# Patient Record
Sex: Female | Born: 1980 | Race: Asian | Hispanic: No | Marital: Married | Smoking: Never smoker
Health system: Southern US, Community
[De-identification: ages and names within clinical notes are randomized; demographics above are authoritative.]

## PROBLEM LIST (undated history)

## (undated) DIAGNOSIS — H5210 Myopia, unspecified eye: Secondary | ICD-10-CM

## (undated) DIAGNOSIS — H52209 Unspecified astigmatism, unspecified eye: Secondary | ICD-10-CM

## (undated) HISTORY — DX: Unspecified astigmatism, unspecified eye: H52.209

## (undated) HISTORY — DX: Myopia, unspecified eye: H52.10

---

## 2014-03-10 DIAGNOSIS — H04129 Dry eye syndrome of unspecified lacrimal gland: Secondary | ICD-10-CM | POA: Insufficient documentation

## 2014-03-10 DIAGNOSIS — H5210 Myopia, unspecified eye: Secondary | ICD-10-CM | POA: Insufficient documentation

## 2014-11-19 DIAGNOSIS — S52009A Unspecified fracture of upper end of unspecified ulna, initial encounter for closed fracture: Secondary | ICD-10-CM | POA: Insufficient documentation

## 2015-01-10 ENCOUNTER — Ambulatory Visit (INDEPENDENT_AMBULATORY_CARE_PROVIDER_SITE_OTHER): Payer: 59 | Admitting: Family Medicine

## 2015-01-10 ENCOUNTER — Encounter: Payer: Self-pay | Admitting: Family Medicine

## 2015-01-10 ENCOUNTER — Ambulatory Visit (HOSPITAL_BASED_OUTPATIENT_CLINIC_OR_DEPARTMENT_OTHER)
Admission: RE | Admit: 2015-01-10 | Discharge: 2015-01-10 | Disposition: A | Discharge status: Home / Self Care | Payer: 59 | Source: Ambulatory Visit | Attending: Family Medicine | Admitting: Family Medicine

## 2015-01-10 VITALS — BP 117/77 | HR 77 | Ht 65.0 in | Wt 120.0 lb

## 2015-01-10 DIAGNOSIS — S52002D Unspecified fracture of upper end of left ulna, subsequent encounter for closed fracture with routine healing: Secondary | ICD-10-CM | POA: Diagnosis present

## 2015-01-10 DIAGNOSIS — X58XXXD Exposure to other specified factors, subsequent encounter: Secondary | ICD-10-CM | POA: Diagnosis not present

## 2015-01-10 DIAGNOSIS — S52002A Unspecified fracture of upper end of left ulna, initial encounter for closed fracture: Secondary | ICD-10-CM | POA: Diagnosis not present

## 2015-01-10 DIAGNOSIS — M25732 Osteophyte, left wrist: Secondary | ICD-10-CM | POA: Diagnosis not present

## 2015-01-10 NOTE — Patient Instructions (Signed)
Your proximal ulna fracture is healing excellently with bony callus. At this point your pain and lack of motion are because of stiffness from the cast. [place] [facility] [place] [facility] - hopefully you can get a couple visits in before you leave next Thursday. Take the prescription [place] you as [place]. Take tylenol or motrin only if needed for pain. Call me if you have any questions. It will take you a few weeks more with therapy until you feel back to normal.

## 2015-01-12 DIAGNOSIS — R6889 Other general symptoms and signs: Secondary | ICD-10-CM | POA: Insufficient documentation

## 2015-01-12 NOTE — Assessment & Plan Note (Signed)
over 7 weeks out from injury.  Independently reviewed radiographs today and excellent callus formation noted.  Her pain is likely due to stiffness and deconditioning at this point.  Discussed options and she will start physical therapy and home exercises.  [place] so provided with a script to take there to continue PT.  Call us with any questions.

## 2015-01-12 NOTE — Progress Notes (Signed)
PCP and consultation requested by Katherina MiresAnna Willis MD.   Subjective:   HPI: Patient is a 34 y.o. female here for left arm injury.  Patient reports she fell down the stairs on 11/18/14 and sustained an injury to left proximal forearm. Radiographs showed a displaced proximal ulna fracture. Pain, swelling, bruising were severe initially. Plan was for surgery (ORIF) but she was hesitant to do this and opted for conservative care. Had long arm cast for 2 weeks then short arm cast for 2 weeks. She has continued 3/10 level of pain diffusely about elbow and forearm, weakness of the arm. Has not done any therapy to date. No skin changes, fever, other complaints currently  No past medical history on file.  No current outpatient prescriptions on file prior to visit.   No current facility-administered medications on file prior to visit.    No past surgical history on file.  No Known Allergies  Social History   Social History  . Marital Status: Married    Spouse Name: N/A  . Number of Children: N/A  . Years of Education: N/A   Occupational History  . Not on file.   Social History Main Topics  . Smoking status: Never Smoker   . Smokeless tobacco: Not on file  . Alcohol Use: Not on file  . Drug Use: Not on file  . Sexual Activity: Not on file   Other Topics Concern  . Not on file   Social History Narrative  . No narrative on file    No family history on file.  BP 117/77 mmHg  Pulse 77  Ht 5\' 5"  (1.651 m)  Wt 120 lb (54.432 kg)  BMI 19.97 kg/m2  Review of Systems: See HPI above.    Objective:  Physical Exam:  Gen: NAD  Left arm: No gross deformity, swelling, bruising. No tenderness currently at fracture site, elbow, wrist, elsewhere about arm. FROM though 4/5 strength with elbow flexion and extension. Collateral ligaments intact of elbow. NVI distally.  Right arm: FROM elbow and wrist without pain.    Assessment & Plan:  1. Left proximal ulna fracture - over  7 weeks out from injury.  Independently reviewed radiographs today and excellent callus formation noted.  Her pain is likely due to stiffness and deconditioning at this point.  Discussed options and she will start physical therapy and home exercises.  [place] so provided with a script to take there to continue PT.  Call us with any questions.

## 2016-11-13 IMAGING — DX DG FOREARM 2V*L*
2 series · 2 of 2 positions shown · non-contrast
Comparison: None.  No prior studies are available at this time.

CLINICAL DATA: Proximal ulnar fracture

EXAM:
LEFT FOREARM - 2 VIEW

[forearm ap]
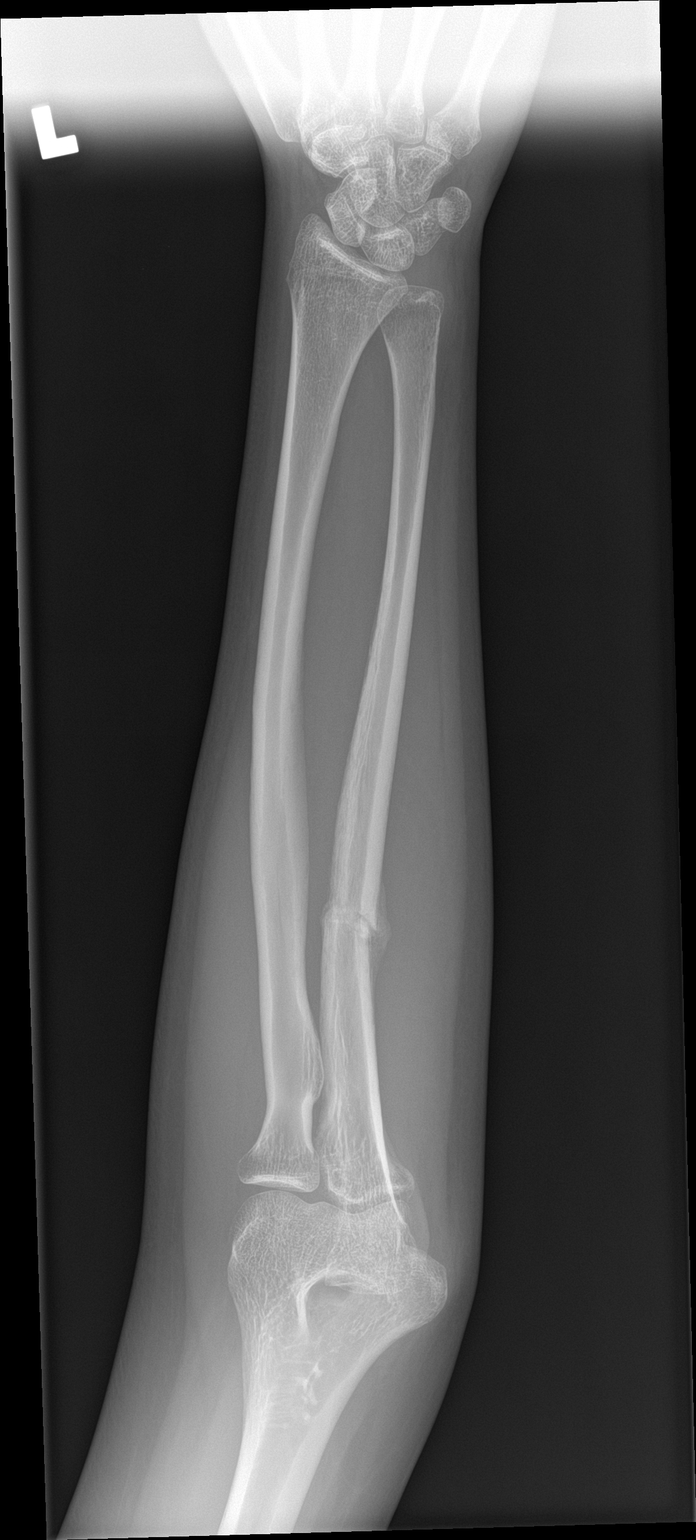

[forearm lat]
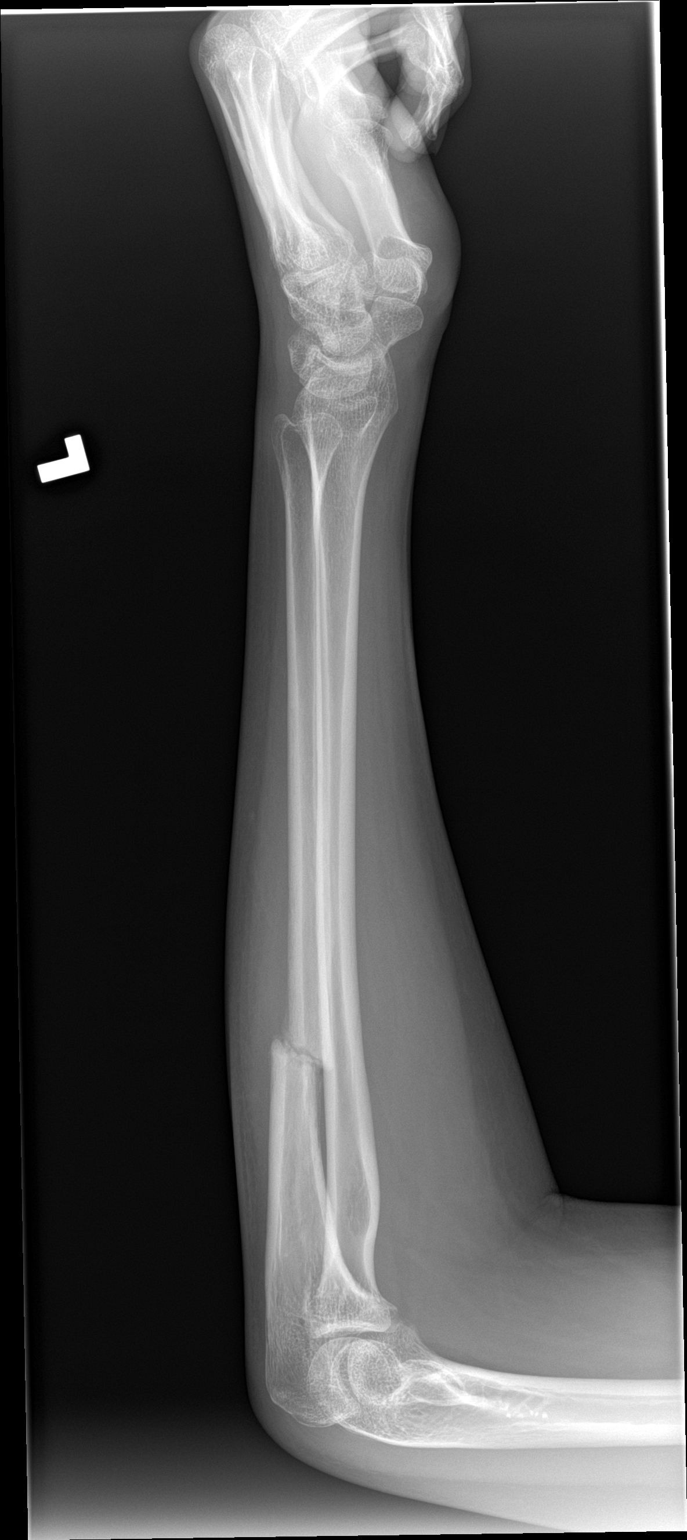

[2 of 2 positions shown; findings below may reference images not displayed]

FINDINGS: Frontal and lateral views obtained. There is a transversely oriented
fracture at the junction of the proximal and mid thirds of the ulna
with moderate callus formation. Alignment is near anatomic at the
fracture site. No other fracture. No dislocation. Joint spaces
appear intact. No evidence of elbow joint effusion. Incidental note
is made of a minus ulnar variant.
IMPRESSION: Healing fracture at the junction of the proximal and mid thirds of
the ulna. Alignment is near anatomic with moderate callus formation.
Note other fracture. No dislocation. Minus ulnar variant noted
incidentally.

## 2019-06-15 HISTORY — PX: OTHER PROCEDURE: U1053

## 2019-06-16 HISTORY — PX: OTHER PROCEDURE: U1053

## 2019-07-09 ENCOUNTER — Encounter: Payer: Self-pay | Admitting: Ophthalmology

## 2019-07-09 ENCOUNTER — Ambulatory Visit: Payer: BC Managed Care – PPO | Attending: Ophthalmology | Admitting: Ophthalmology

## 2019-07-09 DIAGNOSIS — H538 Other visual disturbances: Secondary | ICD-10-CM | POA: Insufficient documentation

## 2019-07-09 DIAGNOSIS — H02889 Meibomian gland dysfunction of unspecified eye, unspecified eyelid: Secondary | ICD-10-CM | POA: Insufficient documentation

## 2019-07-09 DIAGNOSIS — H04123 Dry eye syndrome of bilateral lacrimal glands: Secondary | ICD-10-CM | POA: Insufficient documentation

## 2019-07-09 MED ORDER — REFRESH OP: 1.0000 [drp] | OPHTHALMIC | Status: AC | PRN

## 2019-07-09 NOTE — Progress Notes (Signed)
New patient  Referred by Dr. 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  Pt concerned that OD vision (even with her prior glasses) is not as sharp as prior to surgery.       DED/MGD  Cont aggressive tx    Astigmatism  Appears that astigmatism slightly higher OD than prior.     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    RTC PRN  F/u w/ Dr. Chipper Oman

## 2019-07-14 ENCOUNTER — Ambulatory Visit: Payer: BC Managed Care – PPO | Admitting: Ophthalmology

## 2019-08-05 ENCOUNTER — Ambulatory Visit: Payer: BC Managed Care – PPO | Attending: Ophthalmology | Admitting: Ophthalmology

## 2019-08-05 ENCOUNTER — Encounter: Payer: Self-pay | Admitting: Ophthalmology

## 2019-08-05 DIAGNOSIS — H02889 Meibomian gland dysfunction of unspecified eye, unspecified eyelid: Secondary | ICD-10-CM | POA: Insufficient documentation

## 2019-08-05 DIAGNOSIS — H04123 Dry eye syndrome of bilateral lacrimal glands: Secondary | ICD-10-CM | POA: Insufficient documentation

## 2019-08-05 DIAGNOSIS — H02886 Meibomian gland dysfunction of left eye, unspecified eyelid: Secondary | ICD-10-CM | POA: Insufficient documentation

## 2019-08-05 DIAGNOSIS — H538 Other visual disturbances: Secondary | ICD-10-CM | POA: Insufficient documentation

## 2019-08-05 DIAGNOSIS — H02883 Meibomian gland dysfunction of right eye, unspecified eyelid: Secondary | ICD-10-CM | POA: Insufficient documentation

## 2019-08-05 NOTE — Progress Notes (Signed)
Referred by Dr. 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  Pt concerned that OD vision (even with her prior glasses) is not as sharp as prior to surgery.       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             Saw Dr. Alveta Heimlich, here for 2nd opinion   -normal cell count, OCT mac, topo at visit with Dr. Alveta Heimlich     Surface dry OU, patient notes vision fluctuation, has been 2 months since surgery, rarely using preserved tears    Also notes "line of light" hanging in vision, unclear cause    Discussed importance of PFAT 4-6 times a day for the next month, patient correctable to 20/20 OU today with slight refractive shift     RTC PRN  Cont care with Dr. Marella Bile as directed     I reviewed and confirmed all history components, including the HPI; and any other elements performed by the technician.  Incident to Additional Comments by Signing Physician
